# Patient Record
Sex: Female | Born: 1935 | Race: White | Hispanic: No | Marital: Married | Smoking: Former smoker
Health system: Southern US, Community
[De-identification: ages and names within clinical notes are randomized; demographics above are authoritative.]

## PROBLEM LIST (undated history)

## (undated) DIAGNOSIS — E785 Hyperlipidemia, unspecified: Secondary | ICD-10-CM

## (undated) DIAGNOSIS — G629 Polyneuropathy, unspecified: Secondary | ICD-10-CM

## (undated) DIAGNOSIS — F329 Major depressive disorder, single episode, unspecified: Secondary | ICD-10-CM

## (undated) DIAGNOSIS — F32A Depression, unspecified: Secondary | ICD-10-CM

## (undated) DIAGNOSIS — M810 Age-related osteoporosis without current pathological fracture: Secondary | ICD-10-CM

## (undated) DIAGNOSIS — E079 Disorder of thyroid, unspecified: Secondary | ICD-10-CM

## (undated) DIAGNOSIS — K2970 Gastritis, unspecified, without bleeding: Secondary | ICD-10-CM

## (undated) DIAGNOSIS — F419 Anxiety disorder, unspecified: Secondary | ICD-10-CM

## (undated) HISTORY — PX: ABDOMINAL HYSTERECTOMY: SHX81

## (undated) HISTORY — PX: JOINT REPLACEMENT: SHX530

## (undated) HISTORY — PX: TOTAL HIP ARTHROPLASTY: SHX124

## (undated) HISTORY — PX: HERNIA REPAIR: SHX51

---

## 2000-08-21 ENCOUNTER — Encounter: Payer: Self-pay | Admitting: *Deleted

## 2000-08-21 ENCOUNTER — Emergency Department (HOSPITAL_COMMUNITY): Admission: RE | Admit: 2000-08-21 | Discharge: 2000-08-21 | Payer: Self-pay | Admitting: Pediatrics

## 2005-10-08 ENCOUNTER — Ambulatory Visit: Payer: Self-pay | Admitting: Gastroenterology

## 2012-04-11 ENCOUNTER — Emergency Department
Admission: EM | Admit: 2012-04-11 | Discharge: 2012-04-11 | Disposition: A | Discharge status: Home / Self Care | Payer: MEDICARE | Source: Home / Self Care

## 2012-04-11 DIAGNOSIS — N390 Urinary tract infection, site not specified: Secondary | ICD-10-CM

## 2012-04-11 DIAGNOSIS — B029 Zoster without complications: Secondary | ICD-10-CM

## 2012-04-11 HISTORY — DX: Disorder of thyroid, unspecified: E07.9

## 2012-04-11 LAB — POCT URINALYSIS DIP (MANUAL ENTRY)
Blood, UA: NEGATIVE
Glucose, UA: NEGATIVE
Nitrite, UA: NEGATIVE
Protein Ur, POC: 30
Spec Grav, UA: 1.025 (ref 1.005–1.03)
Urobilinogen, UA: 1 (ref 0–1)
pH, UA: 5.5 (ref 5–8)

## 2012-04-11 MED ORDER — CEPHALEXIN 250 MG/5ML PO SUSR: 500.0000 mg | Freq: Three times a day (TID) | ORAL | Status: AC

## 2012-04-11 MED ORDER — CEFTRIAXONE SODIUM 1 G IJ SOLR
1.0000 g | INTRAMUSCULAR | Status: DC
  Administered 2012-04-11: 1 g via INTRAMUSCULAR

## 2012-04-11 MED ORDER — METHYLPREDNISOLONE ACETATE 80 MG/ML IJ SUSP
60.0000 mg | Freq: Once | INTRAMUSCULAR | Status: AC
  Administered 2012-04-11: 60 mg via INTRAMUSCULAR

## 2012-04-11 NOTE — ED Provider Notes (Signed)
History     CSN: [id number]  Arrival date & time 04/11/12  7829   None     Chief Complaint  Patient presents with  . Dysuria  . Herpes Zoster   Patient is a 78 y.o. female presenting with dysuria and rash. The history is provided by the patient.  Dysuria   Rash   Pt states that she was seen by PCP earlier this week for rash. Pt was diagnosed with shingles ( R lower adbomen) and placed on valacyclovir for this. Pt states that she took 2 doses of medicine and became very nauseous and vomited. Pt states that she has chronic nausea s/p abdominal hernia repair x 2. Has phenergan at home. Pt states that she discussed nausea reaction with PCP who told her that all shingles medicines can cause nausea. Pt states that she has not taken medication since this point. Pt states that she has not tried valacyclovir with phenergan to see if this relieves nausea and is not willing to try this. Pt states that rash has slightly improved since Tuesday. Rash has been present for this last week. Pt denies any purulent drainage or blister formation over rash. Pt has been placing calamine lotion over area. Pt states that she has had her shingles vaccine. Pt states this is her 1st flare of shingles.   Pt also reports dysuria over the last 3 days.  + increased urinary frequency and burning.  No hematuria.  Mild chills, no fever.  Nausea and vomiting prior to onset of dysuria related to antiviral medication per pt.  No vaginal irritation.    Past Medical History  Diagnosis Date  . Thyroid disease     Past Surgical History  Procedure Date  . Hernia repair     History reviewed. No pertinent family history.  History  Substance Use Topics  . Smoking status: Former Games developer  . Smokeless tobacco: Not on file  . Alcohol Use: Yes    OB History    Grav Para Term Preterm Abortions TAB SAB Ect Mult Living                  Review of Systems  Genitourinary: Positive for dysuria.  Skin: Positive for rash.   All other systems reviewed and are negative.    Allergies  Ivp dye and Sulfa antibiotics  Home Medications   Current Outpatient Rx  Name Route Sig Dispense Refill  . LEVOTHYROXINE SODIUM 75 MCG PO TABS Oral Take 75 mcg by mouth daily.    . CEPHALEXIN 250 MG/5ML PO SUSR Oral Take 10 mLs (500 mg total) by mouth 3 (three) times daily. 250 mL 0    BP 112/76  Pulse 96  Temp 97.3 F (36.3 C) (Oral)  Resp 18  Ht 5\' 2"  (1.575 m)  Wt 112 lb (50.803 kg)  BMI 20.49 kg/m2  SpO2 97%  Physical Exam  Constitutional: She is oriented to person, place, and time. She appears well-developed and well-nourished.  HENT:  Head: Normocephalic and atraumatic.  Eyes: Conjunctivae are normal. Pupils are equal, round, and reactive to light.  Neck: Normal range of motion. Neck supple.  Cardiovascular: Normal rate and regular rhythm.   Pulmonary/Chest: Effort normal and breath sounds normal.  Abdominal: Soft.       Mild suprapubic pain  RLQ rash   Musculoskeletal: Normal range of motion.  Neurological: She is alert and oriented to person, place, and time.  Skin: Skin is warm.  Erythematous scaling rash along dermatomal distribution in RLQ (T12/L1)     ED Course  Procedures (including critical care time)   Labs Reviewed  POCT URINALYSIS DIP (MANUAL ENTRY)  URINE CULTURE   No results found.   1. Shingles   2. UTI (lower urinary tract infection)       MDM  1:  Depomedrol 60 mg IM x1 given for inflammatory component of rash  Discussed importance of compliance with antiviral medication for expedited resolution of flare.  Broached issue of use with phenergan to help prevent nausea.  Case discussed with on-call pharmacist.  Risk of nausea is same across this antiviral drug class.  Recommended that pt can use half dose to increase tolerability.  Plan for pt to follow up with PCP in 1-2 days.  General red flags discussed at length with pt.  Handout given  2: Rocephin 1gm  IM x1 Urine culture  Will place on keflex x 7 days.  Discussed using phenergan with this.  Vital signs and physical exam generally reassuring.  Discussed infectious red flags at length.  Follow up with PCP in 1-2 days.       The patient and/or caregiver has been counseled thoroughly with regard to treatment plan and/or medications prescribed including dosage, schedule, interactions, rationale for use, and possible side effects and they verbalize understanding. Diagnoses and expected course of recovery discussed and will return if not improved as expected or if the condition worsens. Patient and/or caregiver verbalized understanding.              Doree Albee, MD 04/11/12 1241

## 2012-04-11 NOTE — ED Notes (Signed)
States she has burning when she urinate x2 days, currentlyhas shingles but states she could not take it d/t nausea

## 2012-04-13 LAB — URINE CULTURE: Colony Count: 25000

## 2012-11-20 ENCOUNTER — Emergency Department
Admission: EM | Admit: 2012-11-20 | Discharge: 2012-11-20 | Disposition: A | Discharge status: Home / Self Care | Payer: MEDICARE | Source: Home / Self Care

## 2012-11-20 ENCOUNTER — Encounter: Payer: Self-pay | Admitting: Emergency Medicine

## 2012-11-20 DIAGNOSIS — R300 Dysuria: Secondary | ICD-10-CM

## 2012-11-20 LAB — POCT CBC W AUTO DIFF (K'VILLE URGENT CARE)

## 2012-11-20 LAB — POCT URINALYSIS DIP (MANUAL ENTRY)
Bilirubin, UA: NEGATIVE
Ketones, POC UA: NEGATIVE

## 2012-11-20 MED ORDER — CEFTRIAXONE SODIUM 1 G IJ SOLR
1.0000 g | Freq: Once | INTRAMUSCULAR | Status: AC
  Administered 2012-11-20: 1 g via INTRAMUSCULAR

## 2012-11-20 NOTE — ED Notes (Signed)
Dysuria x 2 weeks, having reactions to meds and can't get rid of UTI, has appt with urology next Wed, but feels she needs help today

## 2012-11-20 NOTE — ED Notes (Signed)
We set appt today with Dr Edwyna Shell at [facility] @ 2pm, she has appt with them on Wed, April 9, but felt she needed to be seen earlier.

## 2012-11-20 NOTE — ED Provider Notes (Addendum)
History     CSN: [id number]  Arrival date & time 11/20/12  0950   None     Chief Complaint  Patient presents with  . Dysuria   HPI Patient presents today with chief complaint of dysuria. Patient states she's had to 3 urinary tract infection since January. Patient states that she was placed on Cipro by her PCP last week for UTI. Patient states she developed a splotchy rash. Patient states she was switched to [facility] for coverage. Patient states she developed another rash. Patient states she's had mild chills over the past 24 hours. Mild back pain. Patient has followup with urologist early next week the patient states that she cannot wait that long. Mild dysuria mild increased urinary frequency. No hematuria. No discharge. Patient reports having mild right-sided flank pain or past 2-3 days. Patient states she has a right-sided renal cyst. Patient is unsure if this may be associated with flank pain.  Patient is noted to have been seen here the urgent care for similar symptoms back in August of last year.  Patient was placed on Keflex for treatment. She had a subclinical urine culture with multiple bacterial morphotypes.Patient says she had no issues with antibiotics last time.   Past Medical History  Diagnosis Date  . Thyroid disease     Past Surgical History  Procedure Laterality Date  . Hernia repair      Family History  Problem Relation Age of Onset  . Cancer Mother   . Cancer Father     History  Substance Use Topics  . Smoking status: Former Games developer  . Smokeless tobacco: Not on file  . Alcohol Use: Yes    OB History   Grav Para Term Preterm Abortions TAB SAB Ect Mult Living                  Review of Systems  All other systems reviewed and are negative.    Allergies  Amoxicillin; Barium sulfate; Cephalexin; Ciprofloxacin; Dexilant; Doxycycline; Elavil; Fosamax; Iodine; Ivp dye; and Sulfa antibiotics  Home Medications   Current Outpatient Rx  Name  Route  Sig   Dispense  Refill  . doxepin (SINEQUAN) 10 MG capsule   Oral   Take 10 mg by mouth.         . gabapentin (NEURONTIN) 100 MG capsule   Oral   Take 100 mg by mouth 3 (three) times daily.         Marland Kitchen LORazepam (ATIVAN) 0.5 MG tablet   Oral   Take 0.5 mg by mouth every 8 (eight) hours.         . pantoprazole (PROTONIX) 40 MG tablet   Oral   Take 40 mg by mouth daily.         . pravastatin (PRAVACHOL) 40 MG tablet   Oral   Take 40 mg by mouth daily.         . promethazine (PHENERGAN) 25 MG tablet   Oral   Take 25 mg by mouth every 6 (six) hours as needed for nausea.         . quinapril (ACCUPRIL) 5 MG tablet   Oral   Take by mouth at bedtime.         . sucralfate (CARAFATE) 1 G tablet   Oral   Take 1 g by mouth 4 (four) times daily.         Marland Kitchen levothyroxine (SYNTHROID, LEVOTHROID) 75 MCG tablet   Oral   Take 75 mcg by  mouth daily.           BP 151/88  Pulse 88  Temp(Src) 97.9 F (36.6 C) (Oral)  Ht 5\' 2"  (1.575 m)  Wt 109 lb (49.442 kg)  BMI 19.93 kg/m2  SpO2 97%  Physical Exam  Constitutional: She appears well-developed and well-nourished.  HENT:  Head: Normocephalic and atraumatic.  Eyes: Conjunctivae are normal. Pupils are equal, round, and reactive to light.  Neck: Normal range of motion.  Cardiovascular: Normal rate, regular rhythm and normal heart sounds.   Pulmonary/Chest: Effort normal.  Abdominal: Soft. Bowel sounds are normal.  Minimal R sided flank pain  No abdominal pain/suprapubic tenderness   Musculoskeletal: Normal range of motion.  Neurological: She is alert.  Skin: Skin is warm.    ED Course  Procedures (including critical care time)  Labs Reviewed  URINE CULTURE  POCT URINALYSIS DIP (MANUAL ENTRY)  POCT CBC W AUTO DIFF ([facility])   No results found.   1. Dysuria       MDM  Patient afebrile today. White blood cell count within normal limits. Urinalysis with trace leukocytes, otherwise  normal. Patient given 1 g Rocephin IM x1. Urine culture. Patient has taken Rocephin in the past with no reactions. This has become a fairly recurrent issue of the past 2-3 months with patient developing multiple medication/antibiotic allergies over this time frame. Same day appointment made for patient to be reevaluated by urology. Patient states she has someone available that can drive her. Discuss infectious and GU red flags at length with patient.      The patient and/or caregiver has been counseled thoroughly with regard to treatment plan and/or medications prescribed including dosage, schedule, interactions, rationale for use, and possible side effects and they verbalize understanding. Diagnoses and expected course of recovery discussed and will return if not improved as expected or if the condition worsens. Patient and/or caregiver verbalized understanding.             Doree Albee, MD 11/20/12 1105  Doree Albee, MD 11/20/12 [phone]

## 2012-11-22 ENCOUNTER — Telehealth: Payer: Self-pay | Admitting: *Deleted

## 2014-06-18 ENCOUNTER — Emergency Department (INDEPENDENT_AMBULATORY_CARE_PROVIDER_SITE_OTHER): Payer: Medicare PPO

## 2014-06-18 ENCOUNTER — Encounter: Payer: Self-pay | Admitting: Emergency Medicine

## 2014-06-18 ENCOUNTER — Emergency Department
Admission: EM | Admit: 2014-06-18 | Discharge: 2014-06-18 | Disposition: A | Discharge status: Home / Self Care | Payer: Medicare PPO | Source: Home / Self Care | Attending: Emergency Medicine | Admitting: Emergency Medicine

## 2014-06-18 DIAGNOSIS — R52 Pain, unspecified: Secondary | ICD-10-CM

## 2014-06-18 DIAGNOSIS — I700 Atherosclerosis of aorta: Secondary | ICD-10-CM

## 2014-06-18 DIAGNOSIS — M545 Low back pain: Secondary | ICD-10-CM

## 2014-06-18 DIAGNOSIS — R109 Unspecified abdominal pain: Secondary | ICD-10-CM

## 2014-06-18 HISTORY — DX: Depression, unspecified: F32.A

## 2014-06-18 HISTORY — DX: Anxiety disorder, unspecified: F41.9

## 2014-06-18 HISTORY — DX: Age-related osteoporosis without current pathological fracture: M81.0

## 2014-06-18 HISTORY — DX: Major depressive disorder, single episode, unspecified: F32.9

## 2014-06-18 HISTORY — DX: Gastritis, unspecified, without bleeding: K29.70

## 2014-06-18 HISTORY — DX: Polyneuropathy, unspecified: G62.9

## 2014-06-18 LAB — POCT URINALYSIS DIP (MANUAL ENTRY)
Bilirubin, UA: NEGATIVE
Glucose, UA: NEGATIVE
Ketones, POC UA: NEGATIVE
NITRITE UA: NEGATIVE
PROTEIN UA: NEGATIVE
RBC UA: NEGATIVE
SPEC GRAV UA: 1.015
UROBILINOGEN UA: 0.2
pH, UA: 7

## 2014-06-18 NOTE — ED Notes (Signed)
Low back pain x 2 days.

## 2014-06-18 NOTE — ED Notes (Signed)
Patient transported to X-ray via wheelchair 

## 2014-06-18 NOTE — Discharge Instructions (Signed)
Arthritis, Nonspecific °Arthritis is inflammation of a joint. This usually means pain, redness, warmth or swelling are present. One or more joints may be involved. There are a number of types of arthritis. Your caregiver may not be able to tell what type of arthritis you have right away. °CAUSES  °The most common cause of arthritis is the wear and tear on the joint (osteoarthritis). This causes damage to the cartilage, which can break down over time. The knees, hips, back and neck are most often affected by this type of arthritis. °Other types of arthritis and common causes of joint pain include: °· Sprains and other injuries near the joint. Sometimes minor sprains and injuries cause pain and swelling that develop hours later. °· Rheumatoid arthritis. This affects hands, feet and knees. It usually affects both sides of your body at the same time. It is often associated with chronic ailments, fever, weight loss and general weakness. °· Crystal arthritis. Gout and pseudo gout can cause occasional acute severe pain, redness and swelling in the foot, ankle, or knee. °· Infectious arthritis. Bacteria can get into a joint through a break in overlying skin. This can cause infection of the joint. Bacteria and viruses can also spread through the blood and affect your joints. °· Drug, infectious and allergy reactions. Sometimes joints can become mildly painful and slightly swollen with these types of illnesses. °SYMPTOMS  °· Pain is the main symptom. °· Your joint or joints can also be red, swollen and warm or hot to the touch. °· You may have a fever with certain types of arthritis, or even feel overall ill. °· The joint with arthritis will hurt with movement. Stiffness is present with some types of arthritis. °DIAGNOSIS  °Your caregiver will suspect arthritis based on your description of your symptoms and on your exam. Testing may be needed to find the type of arthritis: °· Blood and sometimes urine tests. °· X-ray tests  and sometimes CT or MRI scans. °· Removal of fluid from the joint (arthrocentesis) is done to check for bacteria, crystals or other causes. Your caregiver (or a specialist) will numb the area over the joint with a local anesthetic, and use a needle to remove joint fluid for examination. This procedure is only minimally uncomfortable. °· Even with these tests, your caregiver may not be able to tell what kind of arthritis you have. Consultation with a specialist (rheumatologist) may be helpful. °TREATMENT  °Your caregiver will discuss with you treatment specific to your type of arthritis. If the specific type cannot be determined, then the following general recommendations may apply. °Treatment of severe joint pain includes: °· Rest. °· Elevation. °· Anti-inflammatory medication (for example, ibuprofen) may be prescribed. Avoiding activities that cause increased pain. °· Only take over-the-counter or prescription medicines for pain and discomfort as recommended by your caregiver. °· Cold packs over an inflamed joint may be used for 10 to 15 minutes every hour. Hot packs sometimes feel better, but do not use overnight. Do not use hot packs if you are diabetic without your caregiver's permission. °· A cortisone shot into arthritic joints may help reduce pain and swelling. °· Any acute arthritis that gets worse over the next 1 to 2 days needs to be looked at to be sure there is no joint infection. °Long-term arthritis treatment involves modifying activities and lifestyle to reduce joint stress jarring. This can include weight loss. Also, exercise is needed to nourish the joint cartilage and remove waste. This helps keep the muscles   around the joint strong. °HOME CARE INSTRUCTIONS  °· Do not take aspirin to relieve pain if gout is suspected. This elevates uric acid levels. °· Only take over-the-counter or prescription medicines for pain, discomfort or fever as directed by your caregiver. °· Rest the joint as much as  possible. °· If your joint is swollen, keep it elevated. °· Use crutches if the painful joint is in your leg. °· Drinking plenty of fluids may help for certain types of arthritis. °· Follow your caregiver's dietary instructions. °· Try low-impact exercise such as: °¨ Swimming. °¨ Water aerobics. °¨ Biking. °¨ Walking. °· Morning stiffness is often relieved by a warm shower. °· Put your joints through regular range-of-motion. °SEEK MEDICAL CARE IF:  °· You do not feel better in 24 hours or are getting worse. °· You have side effects to medications, or are not getting better with treatment. °SEEK IMMEDIATE MEDICAL CARE IF:  °· You have a fever. °· You develop severe joint pain, swelling or redness. °· Many joints are involved and become painful and swollen. °· There is severe back pain and/or leg weakness. °· You have loss of bowel or bladder control. °Document Released: 09/12/2004 Document Revised: 10/28/2011 Document Reviewed: 09/28/2008 °ExitCare® Patient Information ©2015 [employer]. This information is not intended to replace advice given to you by your health care provider. Make sure you discuss any questions you have with your health care provider. ° °

## 2014-06-18 NOTE — ED Provider Notes (Signed)
CSN: [id number]     Arrival date & time 06/18/14  1412 History   First MD Initiated Contact with Patient 06/18/14 1439     Chief Complaint  Patient presents with  . Back Pain   (Consider location/radiation/quality/duration/timing/severity/associated sxs/prior Treatment) Patient is a 80 y.o. female presenting with back pain. The history is provided by the patient. No language interpreter was used.  Back Pain Location:  Lumbar spine Quality:  Aching Radiates to:  Does not radiate Pain severity:  Moderate Pain is:  Same all the time Onset quality:  Sudden Timing:  Constant Progression:  Worsening Chronicity:  New Context: not recent illness   Relieved by:  Nothing Worsened by:  Nothing tried Ineffective treatments:  None tried Associated symptoms: no abdominal pain   Risk factors: no hx of cancer    patient complains of pain in her low back pain is worse with walking worse with moving she reports she is also concerned about urinary tract infection. Patient reports that she thinks she has arthritis in her back.  Past Medical History  Diagnosis Date  . Thyroid disease   . Gastritis   . Depression   . Neuropathy   . Anxiety   . Osteoporosis    Past Surgical History  Procedure Laterality Date  . Hernia repair    . Joint replacement     Family History  Problem Relation Age of Onset  . Cancer Mother   . Cancer Father    History  Substance Use Topics  . Smoking status: Former Games developermoker  . Smokeless tobacco: Not on file  . Alcohol Use: Yes   OB History   Grav Para Term Preterm Abortions TAB SAB Ect Mult Living                 Review of Systems  Gastrointestinal: Negative for abdominal pain.  Musculoskeletal: Positive for back pain.  All other systems reviewed and are negative.   Allergies  Amoxicillin; Azithromycin; Barium sulfate; Cephalexin; Ciprofloxacin; Dexilant; Doxycycline; Elavil; Fosamax; Iodine; Ivp dye; Macrobid; Metoclopramide; Metronidazole; Sulfa  antibiotics; Tricyclic antidepressants; and Zofran  Home Medications   Prior to Admission medications   Medication Sig Start Date End Date Taking? Authorizing Provider  doxepin (SINEQUAN) 10 MG capsule Take 10 mg by mouth.    Historical Provider, MD  gabapentin (NEURONTIN) 100 MG capsule Take 100 mg by mouth 3 (three) times daily.    Historical Provider, MD  levothyroxine (SYNTHROID, LEVOTHROID) 75 MCG tablet Take 75 mcg by mouth daily.    Historical Provider, MD  LORazepam (ATIVAN) 0.5 MG tablet Take 0.5 mg by mouth every 8 (eight) hours.    Historical Provider, MD  pantoprazole (PROTONIX) 40 MG tablet Take 40 mg by mouth daily.    Historical Provider, MD  pravastatin (PRAVACHOL) 40 MG tablet Take 40 mg by mouth daily.    Historical Provider, MD  promethazine (PHENERGAN) 25 MG tablet Take 25 mg by mouth every 6 (six) hours as needed for nausea.    Historical Provider, MD  quinapril (ACCUPRIL) 5 MG tablet Take by mouth at bedtime.    Historical Provider, MD  sucralfate (CARAFATE) 1 G tablet Take 1 g by mouth 4 (four) times daily.    Historical Provider, MD   BP 149/85  Pulse 98  Temp(Src) 98.2 F (36.8 C) (Oral)  Resp 18  Ht 5\' 2"  (1.575 m)  Wt 110 lb (49.896 kg)  BMI 20.11 kg/m2  SpO2 98% Physical Exam  Nursing note and vitals reviewed.  Constitutional: She is oriented to person, place, and time. She appears well-developed and well-nourished.  HENT:  Head: Normocephalic and atraumatic.  Eyes: Conjunctivae and EOM are normal. Pupils are equal, round, and reactive to light.  Neck: Normal range of motion.  Cardiovascular: Normal rate and normal heart sounds.   Pulmonary/Chest: Effort normal.  Abdominal: Soft. She exhibits no distension.  Musculoskeletal:  Tender lumbar spine,  Pain with being forward, decreased range of motion,  nv and ns intact   Neurological: She is alert and oriented to person, place, and time.  Psychiatric: She has a normal mood and affect.    ED Course   Procedures (including critical care time) Labs Review Labs Reviewed  POCT URINALYSIS DIP (MANUAL ENTRY)    Imaging Review Dg Lumbar Spine Complete  06/18/2014   CLINICAL DATA:  Two day history of low back pain.  No injury noted.  EXAM: LUMBAR SPINE - COMPLETE 4+ VIEW  COMPARISON:  None.  FINDINGS: Frontal, lateral, spot lumbosacral lateral, and bilateral oblique views were obtained. There are 5 non-rib-bearing lumbar type vertebral bodies. There is no fracture or spondylolisthesis. Disc spaces appear intact. There is facet osteoarthritic change at L5-S1 bilaterally. There are foci of atherosclerotic calcification in the aorta.  IMPRESSION: Mild osteoarthritic change at L5-S1. Atherosclerotic calcification in the aorta. No fracture or spondylolisthesis.   Electronically Signed   By: Bretta BangWilliam  Woodruff M.D.   On: 06/18/2014 15:56   Dg Pelvis 1-2 Views  06/18/2014   CLINICAL DATA:  Two day history of pain.  EXAM: PELVIS - 1-2 VIEW  COMPARISON:  None.  FINDINGS: There is a total hip prosthesis on the left which is well seated. There is mild narrowing of the right hip joint. No fracture or dislocation. No erosive change.  IMPRESSION: Mild narrowing right hip joint. Total hip prosthesis on the left, well-seated. No fracture or dislocation.   Electronically Signed   By: Bretta BangWilliam  Woodruff M.D.   On: 06/18/2014 15:56     MDM   1. Flank pain   2. Pain    Patient counseled on x-ray result on arthritis I will send her urine for culture as urine shows leukocytes but no nitrates patient does not have urinary symptoms only pain in back that seems to be muscular versus skeletal patient reports that she only takes Tylenol for pain she does not want pain medicine at this time because it causes her GI problems. Patient will follow-up with her physician Dr. Regino Schultzeitter Brown     Elson AreasLeslie K Miracle Criado, PA-C 06/18/14 1626

## 2014-06-20 LAB — URINE CULTURE

## 2015-01-11 ENCOUNTER — Emergency Department
Admission: EM | Admit: 2015-01-11 | Discharge: 2015-01-11 | Disposition: A | Discharge status: Home / Self Care | Payer: Medicare HMO | Source: Home / Self Care | Attending: Family Medicine | Admitting: Family Medicine

## 2015-01-11 ENCOUNTER — Encounter: Payer: Self-pay | Admitting: *Deleted

## 2015-01-11 DIAGNOSIS — L84 Corns and callosities: Secondary | ICD-10-CM

## 2015-01-11 NOTE — Discharge Instructions (Signed)
Apply a piece of Moleskin to each side of affected toes.  Remove pads and replace when they become wet.   Corns and Calluses Corns are small areas of thickened skin that usually occur on the top, sides, or tip of a toe. They contain a cone-shaped core with a point that can press on a nerve below. This causes pain. Calluses are areas of thickened skin that usually develop on hands, fingers, palms, soles of the feet, and heels. These are areas that experience frequent friction or pressure. CAUSES  Corns are usually the result of rubbing (friction) or pressure from shoes that are too tight or do not fit properly. Calluses are caused by repeated friction and pressure on the affected areas. SYMPTOMS  A hard growth on the skin.  Pain or tenderness under the skin.  Sometimes, redness and swelling.  Increased discomfort while wearing tight-fitting shoes. DIAGNOSIS  Your caregiver can usually tell what the problem is by doing a physical exam. TREATMENT  Removing the cause of the friction or pressure is usually the only treatment needed. However, sometimes medicines can be used to help soften the hardened, thickened areas. These medicines include salicylic acid plasters and 12% ammonium lactate lotion. These medicines should only be used under the direction of your caregiver. HOME CARE INSTRUCTIONS   Try to remove pressure from the affected area.  You may wear donut-shaped corn pads to protect your skin.  You may use a pumice stone or nonmetallic nail file to gently reduce the thickness of a corn.  Wear properly fitted footwear.  If you have calluses on the hands, wear gloves during activities that cause friction.  If you have diabetes, you should regularly examine your feet. Tell your caregiver if you notice any problems with your feet. SEEK IMMEDIATE MEDICAL CARE IF:   You have increased pain, swelling, redness, or warmth in the affected area.  Your corn or callus starts to drain fluid  or bleeds.  You are not getting better, even with treatment. Document Released: 05/11/2004 Document Revised: 10/28/2011 Document Reviewed: 04/02/2011 [facility] Patient Information 2015 [employer], Maryland[employer]. This information is not intended to replace advice given to you by your health care provider. Make sure you discuss any questions you have with your health care provider.

## 2015-01-11 NOTE — ED Notes (Signed)
Pt c/o RT 3rd toe pain x 2 wks. Denies injury.

## 2015-01-11 NOTE — ED Provider Notes (Signed)
CSN: [id number]     Arrival date & time 01/11/15  1049 History   First MD Initiated Contact with Patient 01/11/15 1156     Chief Complaint  Patient presents with  . Toe Pain      HPI Comments: Patient complains of two week history of pain betwee her right 2nd and 3rd toes.  She denies injury  Patient is a 81 y.o. female presenting with toe pain. The history is provided by the patient.  Toe Pain This is a new problem. Episode onset: 2 weeks ago. The problem occurs constantly. The problem has not changed since onset.Associated symptoms comments: none. The symptoms are aggravated by walking (wearing shoes). Nothing relieves the symptoms. Treatments tried: cotton pad between toes. The treatment provided mild relief.    Past Medical History  Diagnosis Date  . Thyroid disease   . Gastritis   . Depression   . Neuropathy   . Anxiety   . Osteoporosis    Past Surgical History  Procedure Laterality Date  . Hernia repair    . Joint replacement     Family History  Problem Relation Age of Onset  . Cancer Mother   . Cancer Father    History  Substance Use Topics  . Smoking status: Former Games developermoker  . Smokeless tobacco: Not on file  . Alcohol Use: Yes   OB History    No data available     Review of Systems  All other systems reviewed and are negative.   Allergies  Amoxicillin; Azithromycin; Barium sulfate; Cephalexin; Ciprofloxacin; Dexilant; Doxycycline; Elavil; Fosamax; Iodine; Ivp dye; Macrobid; Metoclopramide; Metronidazole; Sulfa antibiotics; Tricyclic antidepressants; and Zofran  Home Medications   Prior to Admission medications   Medication Sig Start Date End Date Taking? Authorizing Provider  doxepin (SINEQUAN) 10 MG capsule Take 10 mg by mouth.    Historical Provider, MD  gabapentin (NEURONTIN) 100 MG capsule Take 100 mg by mouth 3 (three) times daily.    Historical Provider, MD  levothyroxine (SYNTHROID, LEVOTHROID) 75 MCG tablet Take 75 mcg by mouth daily.     Historical Provider, MD  LORazepam (ATIVAN) 0.5 MG tablet Take 0.5 mg by mouth every 8 (eight) hours.    Historical Provider, MD  pantoprazole (PROTONIX) 40 MG tablet Take 40 mg by mouth daily.    Historical Provider, MD  pravastatin (PRAVACHOL) 40 MG tablet Take 40 mg by mouth daily.    Historical Provider, MD  promethazine (PHENERGAN) 25 MG tablet Take 25 mg by mouth every 6 (six) hours as needed for nausea.    Historical Provider, MD  quinapril (ACCUPRIL) 5 MG tablet Take by mouth at bedtime.    Historical Provider, MD  sucralfate (CARAFATE) 1 G tablet Take 1 g by mouth 4 (four) times daily.    Historical Provider, MD   BP 137/81 mmHg  Pulse 91  Temp(Src) 98.1 F (36.7 C) (Oral)  Resp 16  Wt 114 lb (51.71 kg)  SpO2 99% Physical Exam  Constitutional: She is oriented to person, place, and time. She appears well-developed and well-nourished. No distress.  HENT:  Head: Normocephalic.  Eyes: Pupils are equal, round, and reactive to light.  Neck: Neck supple.  Cardiovascular: Normal heart sounds.   Pulmonary/Chest: Breath sounds normal.  Abdominal: There is no tenderness.  Musculoskeletal:       Right foot: There is normal range of motion, no tenderness, no bony tenderness, no swelling, normal capillary refill and no deformity.       Feet:  Right foot has a bunion present. The contact surfaces between the 2nd and 3rd toes each have a callus present that is mildly tender to palpation.  There is no swelling, erythema, or drainage present.  Neurological: She is alert and oriented to person, place, and time.  Skin: Skin is warm and dry.  Nursing note and vitals reviewed.   ED Course  Procedures  none   MDM   1. Skin callus; no evidence cellulitis     Apply a piece of Moleskin to each side of affected toes.  Remove pads and replace when they become wet. Followup with Family Doctor if not improved in one week.     Lattie Haw, MD 01/16/15 1351

## 2015-08-16 ENCOUNTER — Emergency Department (INDEPENDENT_AMBULATORY_CARE_PROVIDER_SITE_OTHER): Payer: Medicare HMO

## 2015-08-16 ENCOUNTER — Emergency Department
Admission: EM | Admit: 2015-08-16 | Discharge: 2015-08-16 | Disposition: A | Discharge status: Home / Self Care | Payer: Medicare HMO | Source: Home / Self Care | Attending: Emergency Medicine | Admitting: Emergency Medicine

## 2015-08-16 ENCOUNTER — Encounter: Payer: Self-pay | Admitting: *Deleted

## 2015-08-16 DIAGNOSIS — S76012A Strain of muscle, fascia and tendon of left hip, initial encounter: Secondary | ICD-10-CM | POA: Diagnosis not present

## 2015-08-16 DIAGNOSIS — Z9889 Other specified postprocedural states: Secondary | ICD-10-CM | POA: Diagnosis not present

## 2015-08-16 DIAGNOSIS — M25551 Pain in right hip: Secondary | ICD-10-CM | POA: Diagnosis not present

## 2015-08-16 NOTE — ED Notes (Signed)
Pt c/o LT hip pain x 5 days. Denies injury. She reports a history of LT hip partial replacement 2 years ago. She has taken Aleve with some relief. She has not taken any OTC meds today.

## 2015-08-16 NOTE — ED Provider Notes (Signed)
CSN: [id number]     Arrival date & time 08/16/15  1040 History   First MD Initiated Contact with Patient 08/16/15 1128     Chief Complaint  Patient presents with  . Hip Pain   (Consider location/radiation/quality/duration/timing/severity/associated sxs/prior Treatment) HPI 5 days ago, she did a lot of activity with walking and bending and lifting, but recalls no trauma. Did not fall. Chief complaint: Left lateral hip pain for 5 days since doing all that activity. 3 out of 5 intensity at rest. 5 out of 10 with movement. Sometimes can go up to 7 or 8 out of 10 with severe movement. It's sharp. Occasionally radiating to left groin but no other GU symptoms. She tried Aleve and that helped somewhat. History of osteoporosis, and she takes Prolia injections twice a year, prescribed by her PCP she states. Left hip replacement surgery 2 years ago she states. Past Medical History  Diagnosis Date  . Thyroid disease   . Gastritis   . Depression   . Neuropathy (HCC)   . Anxiety   . Osteoporosis    Past Surgical History  Procedure Laterality Date  . Hernia repair    . Joint replacement     Family History  Problem Relation Age of Onset  . Cancer Mother   . Cancer Father    Social History  Substance Use Topics  . Smoking status: Former Games developer  . Smokeless tobacco: None  . Alcohol Use: No   OB History    No data available     Review of Systems  Constitutional: Negative for fever.  Respiratory: Negative for shortness of breath.   Cardiovascular: Negative for chest pain.  Gastrointestinal: Negative for nausea, vomiting and abdominal pain.  Genitourinary: Negative.   Skin: Negative for rash.  Neurological: Negative for syncope and numbness.    Allergies  Amoxicillin; Azithromycin; Barium sulfate; Cephalexin; Ciprofloxacin; Dexilant; Doxycycline; Elavil; Fosamax; Iodine; Ivp dye; Macrobid; Metoclopramide; Metronidazole; Sulfa antibiotics; Tricyclic antidepressants; and  Zofran  Home Medications   Prior to Admission medications   Medication Sig Start Date End Date Taking? Authorizing Provider  denosumab (PROLIA) 60 MG/ML SOLN injection Inject 60 mg into the skin every 6 (six) months. Administer in upper arm, thigh, or abdomen   Yes Historical Provider, MD  doxepin (SINEQUAN) 10 MG capsule Take 10 mg by mouth.    Historical Provider, MD  gabapentin (NEURONTIN) 100 MG capsule Take 100 mg by mouth 3 (three) times daily.    Historical Provider, MD  levothyroxine (SYNTHROID, LEVOTHROID) 75 MCG tablet Take 75 mcg by mouth daily.    Historical Provider, MD  LORazepam (ATIVAN) 0.5 MG tablet Take 0.5 mg by mouth every 8 (eight) hours.    Historical Provider, MD  pantoprazole (PROTONIX) 40 MG tablet Take 40 mg by mouth daily.    Historical Provider, MD  pravastatin (PRAVACHOL) 40 MG tablet Take 40 mg by mouth daily.    Historical Provider, MD  promethazine (PHENERGAN) 25 MG tablet Take 25 mg by mouth every 6 (six) hours as needed for nausea.    Historical Provider, MD  quinapril (ACCUPRIL) 5 MG tablet Take by mouth at bedtime.    Historical Provider, MD  sucralfate (CARAFATE) 1 G tablet Take 1 g by mouth 4 (four) times daily.    Historical Provider, MD   Meds Ordered and Administered this Visit  Medications - No data to display  BP 172/84 mmHg  Pulse 96  Temp(Src) 97.4 F (36.3 C) (Oral)  Resp 16  Ht  5\' 2"  (1.575 m)  Wt 115 lb (52.164 kg)  BMI 21.03 kg/m2  SpO2 99% No data found.   Physical Exam  Constitutional: She is oriented to person, place, and time. She appears well-developed and well-nourished. No distress.  HENT:  Head: Normocephalic and atraumatic.  Eyes: Conjunctivae and EOM are normal. Pupils are equal, round, and reactive to light. No scleral icterus.  Neck: Normal range of motion.  Cardiovascular: Normal rate.   Pulmonary/Chest: Effort normal.  Abdominal: She exhibits no distension.  Musculoskeletal: Normal range of motion.       Left  hip: She exhibits tenderness and bony tenderness. She exhibits normal range of motion.       Legs: Left hip: Internal and external rotation within normal limits. These motions do not exacerbate the pain. She can weight-bear, but this causes some left hip pain. Left knee exam normal. Remainder of left lower extremity exam within normal limits. No calf tenderness.  Neurological: She is alert and oriented to person, place, and time.  Skin: Skin is warm.  Psychiatric: She has a normal mood and affect.  Nursing note and vitals reviewed.   ED Course  Procedures (including critical care time)  Labs Review Labs Reviewed - No data to display  Imaging Review Dg Hip Unilat With Pelvis 2-3 Views Left  08/16/2015  CLINICAL DATA:  LEFT hip pain for 5 days. No injury. Prior hip replacement surgery 2 years ago. EXAM: DG HIP (WITH OR WITHOUT PELVIS) 2-3V LEFT COMPARISON:  06/18/2014. FINDINGS: Previous LEFT total hip arthroplasty. No pelvic fracture or prosthetic dislocation. There is slight lucency surrounding the stem in its distal most aspect (arrows). This is not an acute finding, but could contribute to discomfort. It is unclear if this has significantly progressed from priors. Elective Orthopedic consultation may be warranted. IMPRESSION: No evidence for prosthetic dislocation or pelvic fracture. Slight loosening of the stem with periprosthetic lucency inferiorly. Electronically Signed   By: Elsie StainJohn T Curnes M.D.   On: 08/16/2015 12:07       MDM   1. Strain of left hip, initial encounter     X-ray left hip: No evidence for prosthetic dislocation or pelvic fracture. Per radiologist, "Slight loosening of the stem with periprosthetic lucency inferiorly. " I explained this to patient. Questions invited and answered as best I could. I offered pain medication, but she declined any prescription. She prefers to use OTC Aleve as that has helped. She takes Aleve with Protonix to prevent GI  upset. Follow-up with Dr Wylene SimmerPollard ,her orthopedist within 1 week. I gave her a copy of x-ray report. We made her a disc of her left hip x-ray for her to bring with her to orthopedist appointment next week.  Precautions discussed. Red flags discussed.--Emergency room if any red flag Questions invited and answered. Patient voiced understanding and agreement.    Lajean Manesavid Massey, MD 08/16/15 [phone]

## 2016-03-07 IMAGING — CR DG LUMBAR SPINE COMPLETE 4+V
5 series · 5 of 5 positions shown · non-contrast
Comparison: None.

CLINICAL DATA: Two day history of low back pain.  No injury noted.

EXAM:
LUMBAR SPINE - COMPLETE 4+ VIEW

[view not recorded (1 of 5)]
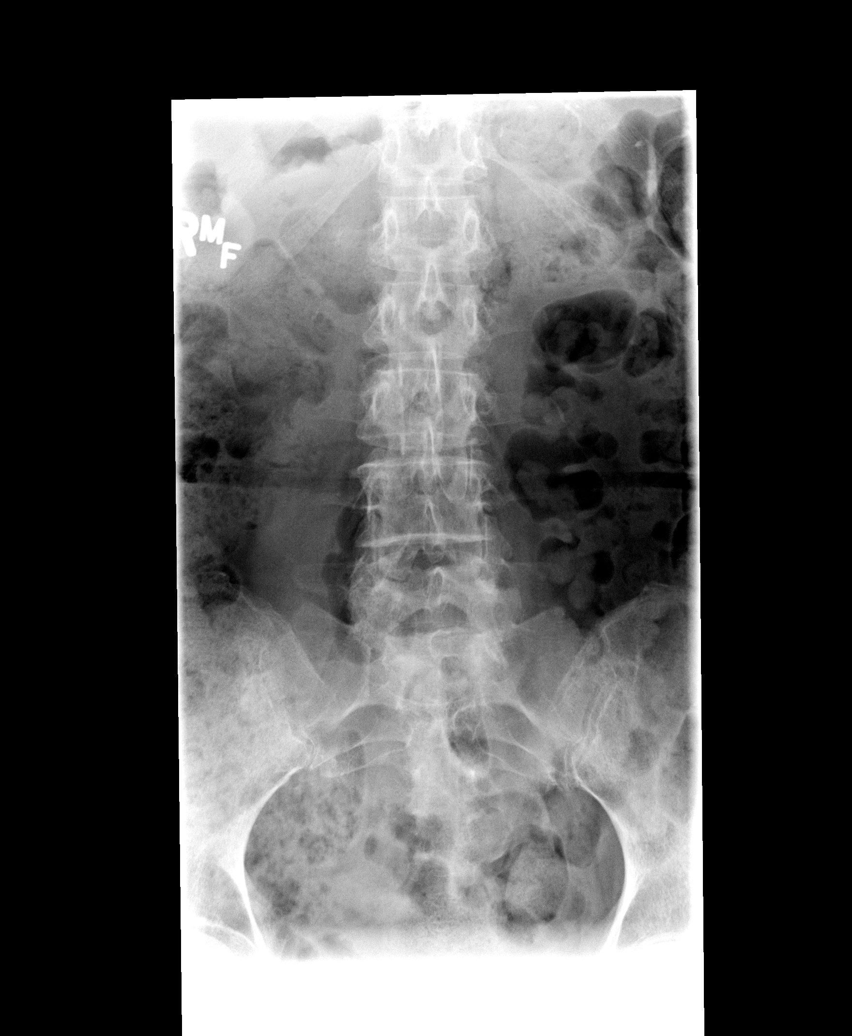

[view not recorded (2 of 5)]
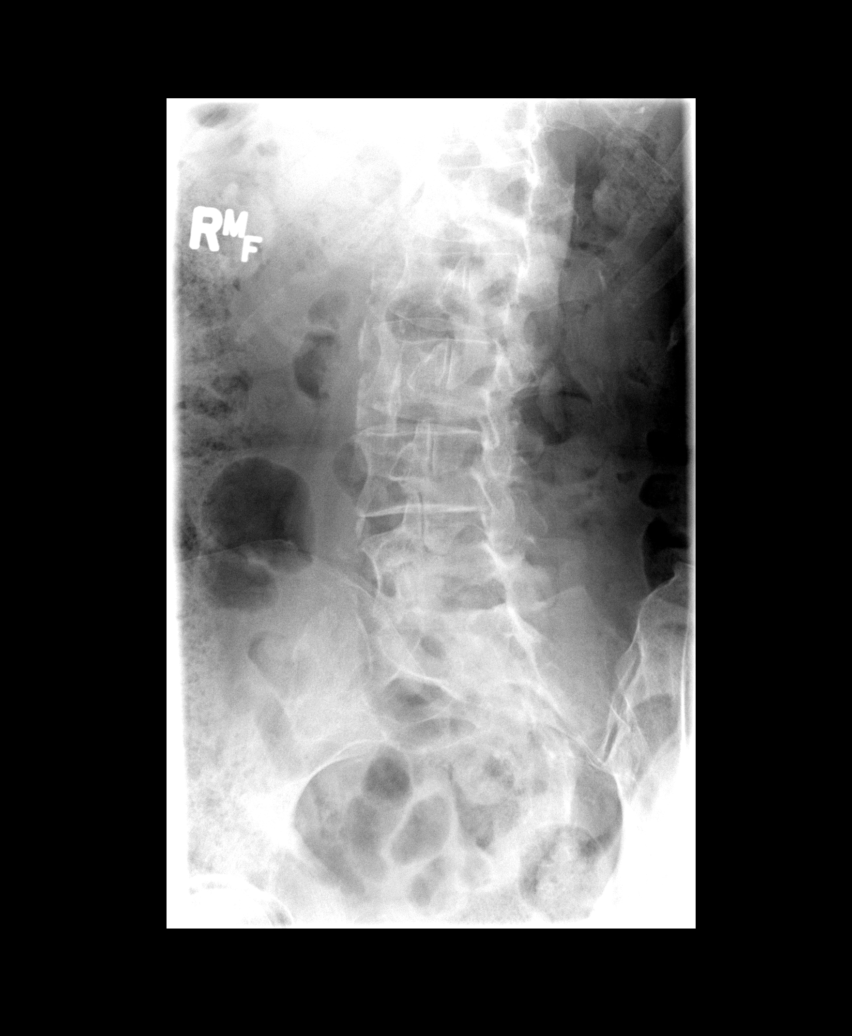

[view not recorded (3 of 5)]
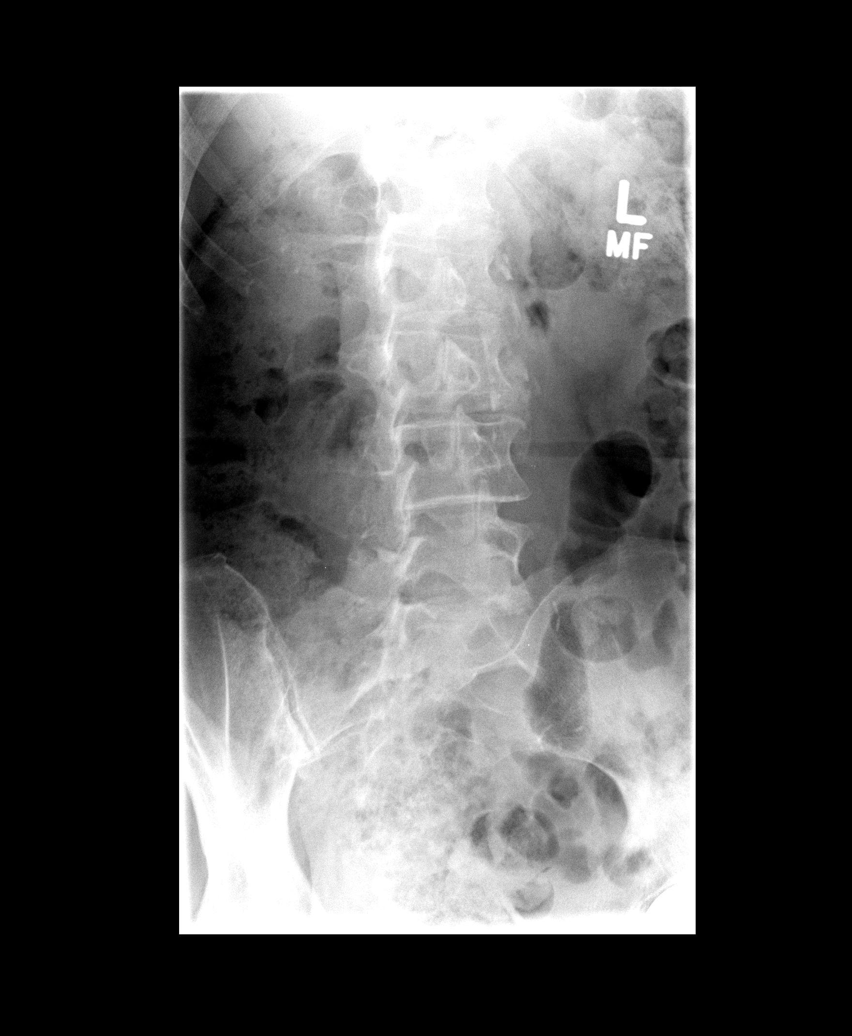

[view not recorded (4 of 5)]
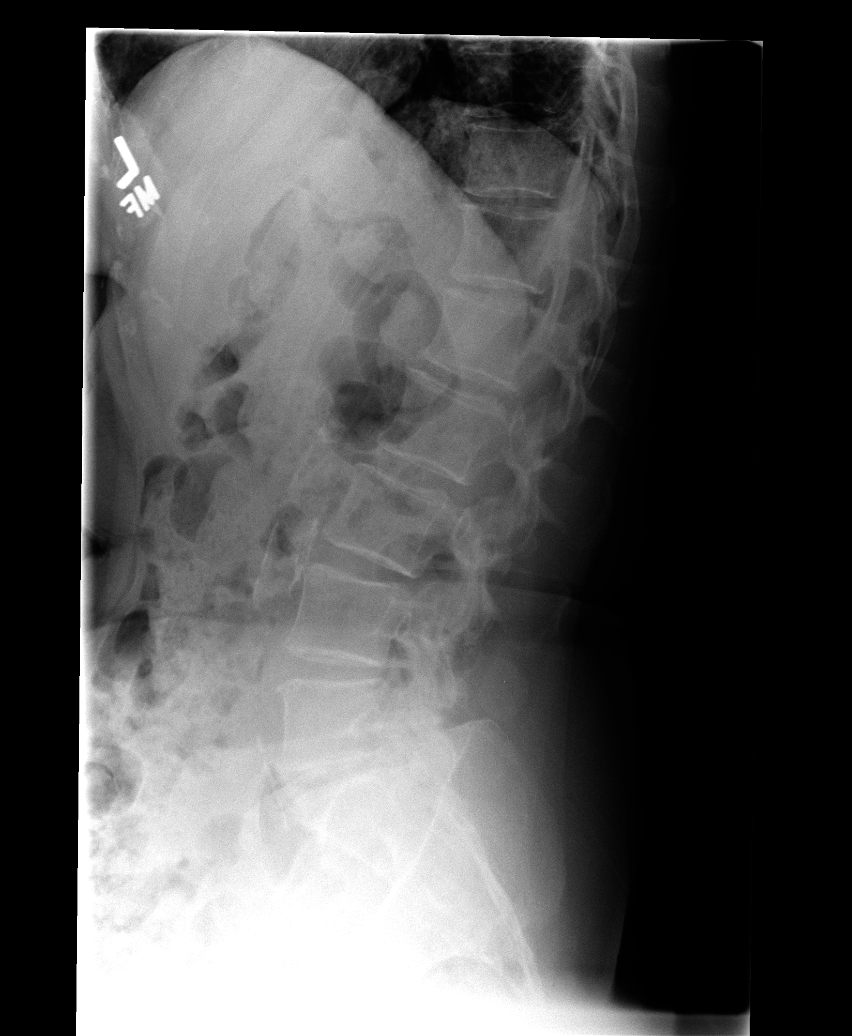

[view not recorded (5 of 5)]
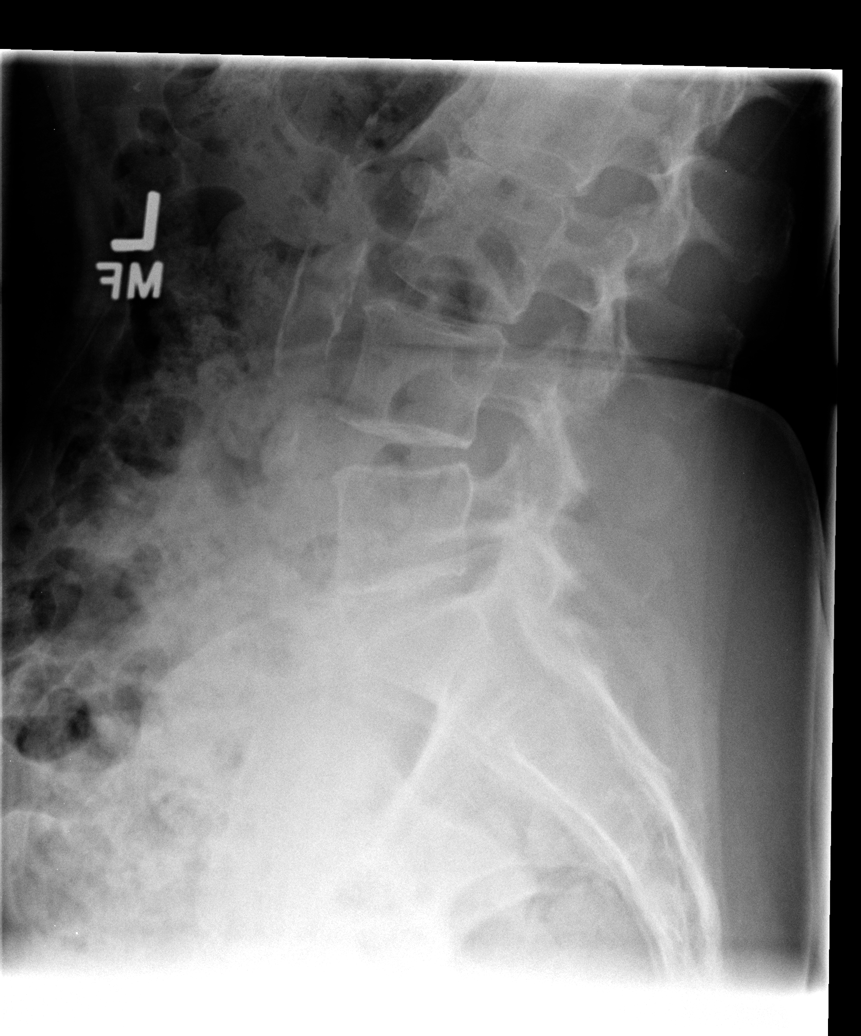

[5 of 5 positions shown; findings below may reference images not displayed]

FINDINGS: Frontal, lateral, spot lumbosacral lateral, and bilateral oblique
views were obtained. There are 5 non-rib-bearing lumbar type
vertebral bodies. There is no fracture or spondylolisthesis. Disc
spaces appear intact. There is facet osteoarthritic change at L5-S1
bilaterally. There are foci of atherosclerotic calcification in the
aorta.
IMPRESSION: Mild osteoarthritic change at L5-S1. Atherosclerotic calcification
in the aorta. No fracture or spondylolisthesis.

## 2016-04-10 ENCOUNTER — Encounter: Payer: Self-pay | Admitting: *Deleted

## 2016-04-10 ENCOUNTER — Emergency Department
Admission: EM | Admit: 2016-04-10 | Discharge: 2016-04-10 | Disposition: A | Discharge status: Home / Self Care | Payer: Medicare HMO | Source: Home / Self Care | Attending: Family Medicine | Admitting: Family Medicine

## 2016-04-10 DIAGNOSIS — H6123 Impacted cerumen, bilateral: Secondary | ICD-10-CM

## 2016-04-10 NOTE — ED Triage Notes (Signed)
Pt c/o LT ear feeling "clogged" x 3 days.

## 2016-04-10 NOTE — ED Provider Notes (Signed)
[facility]    CSN: [id number] Arrival date & time: 04/10/16  1045  First Provider Contact:  First MD Initiated Contact with Patient 04/10/16 1153        History   Chief Complaint Chief Complaint  Patient presents with  . Ear Fullness    HPI Erica Conley is a 82 y.o. female.   Patient complains of sensation of fullness in her left ear for about 3 to 4 days without pain.  She states that she normally wears hearing aids.   The history is provided by the patient.  Ear Fullness  This is a new problem. The current episode started more than 2 days ago. The problem occurs constantly. The problem has not changed since onset.Associated symptoms comments: none. Nothing aggravates the symptoms. Nothing relieves the symptoms. She has tried nothing for the symptoms.    Past Medical History:  Diagnosis Date  . Anxiety   . Depression   . Gastritis   . Neuropathy (HCC)   . Osteoporosis   . Thyroid disease     There are no active problems to display for this patient.   Past Surgical History:  Procedure Laterality Date  . HERNIA REPAIR    . JOINT REPLACEMENT      OB History    No data available       Home Medications    Prior to Admission medications   Medication Sig Start Date End Date Taking? Authorizing Provider  denosumab (PROLIA) 60 MG/ML SOLN injection Inject 60 mg into the skin every 6 (six) months. Administer in upper arm, thigh, or abdomen    Historical Provider, MD  doxepin (SINEQUAN) 10 MG capsule Take 10 mg by mouth.    Historical Provider, MD  gabapentin (NEURONTIN) 100 MG capsule Take 100 mg by mouth 3 (three) times daily.    Historical Provider, MD  levothyroxine (SYNTHROID, LEVOTHROID) 75 MCG tablet Take 75 mcg by mouth daily.    Historical Provider, MD  LORazepam (ATIVAN) 0.5 MG tablet Take 0.5 mg by mouth every 8 (eight) hours.    Historical Provider, MD  pantoprazole (PROTONIX) 40 MG tablet Take 40 mg by mouth daily.    Historical  Provider, MD  pravastatin (PRAVACHOL) 40 MG tablet Take 40 mg by mouth daily.    Historical Provider, MD  promethazine (PHENERGAN) 25 MG tablet Take 25 mg by mouth every 6 (six) hours as needed for nausea.    Historical Provider, MD  quinapril (ACCUPRIL) 5 MG tablet Take by mouth at bedtime.    Historical Provider, MD  sucralfate (CARAFATE) 1 G tablet Take 1 g by mouth 4 (four) times daily.    Historical Provider, MD    Family History Family History  Problem Relation Age of Onset  . Cancer Mother   . Cancer Father     Social History Social History  Substance Use Topics  . Smoking status: Former Games developermoker  . Smokeless tobacco: Never Used  . Alcohol use No     Allergies   Amoxicillin; Azithromycin; Barium sulfate; Cephalexin; Ciprofloxacin; Dexilant [dexlansoprazole]; Doxycycline; Elavil [amitriptyline]; Fosamax [alendronate sodium]; Iodine; Ivp dye [iodinated diagnostic agents]; Macrobid [nitrofurantoin monohyd macro]; Metoclopramide; Metronidazole; Sulfa antibiotics; Tricyclic antidepressants; and Zofran [ondansetron hcl]   Review of Systems Review of Systems  All other systems reviewed and are negative.    Physical Exam Triage Vital Signs ED Triage Vitals  Enc Vitals Group     BP 04/10/16 1132 132/75     Pulse Rate 04/10/16 1132  90     Resp 04/10/16 1132 16     Temp 04/10/16 1132 97.8 F (36.6 C)     Temp Source 04/10/16 1132 Oral     SpO2 04/10/16 1132 98 %     Weight 04/10/16 1132 111 lb (50.3 kg)     Height 04/10/16 1132 5' 2.5" (1.588 m)     Head Circumference --      Peak Flow --      Pain Score 04/10/16 1134 0     Pain Loc --      Pain Edu? --      Excl. in GC? --    No data found.   Updated Vital Signs BP 132/75 (BP Location: Left Arm)   Pulse 90   Temp 97.8 F (36.6 C) (Oral)   Resp 16   Ht 5' 2.5" (1.588 m)   Wt 111 lb (50.3 kg)   SpO2 98%   BMI 19.98 kg/m   Visual Acuity Right Eye Distance:   Left Eye Distance:   Bilateral Distance:     Right Eye Near:   Left Eye Near:    Bilateral Near:     Physical Exam  Constitutional: She appears well-developed and well-nourished. No distress.  HENT:  Head: Normocephalic.  Right Ear: External ear normal.  Left Ear: External ear normal.  Nose: Nose normal.  Mouth/Throat: Oropharynx is clear and moist.  Both canals occluded with cerumen.  Post lavage, both tympanic membranes and canals appear normal.  Eyes: EOM are normal. Pupils are equal, round, and reactive to light.  Neck: Neck supple.  Lymphadenopathy:    She has no cervical adenopathy.  Nursing note and vitals reviewed.    UC Treatments / Results  Labs (all labs ordered are listed, but only abnormal results are displayed) Labs Reviewed - No data to display  Tympanometry:  Right ear tympanogram normal; Left ear tympanogram low peak height  EKG  EKG Interpretation None       Radiology No results found.  Procedures Procedures :Bilateral ear lavage (by nurse)  Medications Ordered in UC Medications - No data to display   Initial Impression / Assessment and Plan / UC Course  I have reviewed the triage vital signs and the nursing notes.  Pertinent labs & imaging results that were available during my care of the patient were reviewed by me and considered in my medical decision making (see chart for details).  Clinical Course   Patient notes that left ear still feels "full" after ear lavage.  Left tympanic membrane and canal appear normal and left tympanogram normal. Recommend followup with ENT if symptoms persist.     Final Clinical Impressions(s) / UC Diagnoses   Final diagnoses:  Cerumen impaction, bilateral    New Prescriptions New Prescriptions   No medications on file     Lattie HawStephen A Ashleymarie Granderson, MD 04/10/16 1232

## 2016-08-07 ENCOUNTER — Encounter: Payer: Self-pay | Admitting: *Deleted

## 2016-08-07 ENCOUNTER — Emergency Department
Admission: EM | Admit: 2016-08-07 | Discharge: 2016-08-07 | Disposition: A | Discharge status: Home / Self Care | Payer: Medicare HMO | Source: Home / Self Care | Attending: Family Medicine | Admitting: Family Medicine

## 2016-08-07 DIAGNOSIS — R21 Rash and other nonspecific skin eruption: Secondary | ICD-10-CM

## 2016-08-07 HISTORY — DX: Hyperlipidemia, unspecified: E78.5

## 2016-08-07 MED ORDER — HYDROCORTISONE 1 % EX CREA: TOPICAL_CREAM | CUTANEOUS | 0 refills | Status: AC

## 2016-08-07 NOTE — ED Provider Notes (Signed)
CSN: [id number]     Arrival date & time 08/07/16  [id number] History   First MD Initiated Contact with Patient 08/07/16 0957     Chief Complaint  Patient presents with  . Rash   (Consider location/radiation/quality/duration/timing/severity/associated sxs/prior Treatment) HPI  Erica Conley is a 82 y.o. female presenting to UC with c/o reddened area to the Left side of her face that started about 2-3 days ago.  She used vaseline ointment this morning with mild improvement. The area is burning slightly but does not itch.  She did start a new cholesterol medication 1 week ago but no other rashes. Denies new soaps, lotions or makeup.     Past Medical History:  Diagnosis Date  . Anxiety   . Depression   . Gastritis   . Hyperlipidemia   . Neuropathy (HCC)   . Osteoporosis   . Thyroid disease    Past Surgical History:  Procedure Laterality Date  . ABDOMINAL HYSTERECTOMY    . HERNIA REPAIR    . JOINT REPLACEMENT    . TOTAL HIP ARTHROPLASTY     Family History  Problem Relation Age of Onset  . Cancer Mother   . Heart disease Mother   . Cancer Father   . Cancer Sister    Social History  Substance Use Topics  . Smoking status: Former Games developermoker  . Smokeless tobacco: Never Used  . Alcohol use No   OB History    No data available     Review of Systems  Constitutional: Negative for chills and fever.  HENT: Negative for sore throat and voice change.   Respiratory: Negative for shortness of breath and wheezing.   Skin: Positive for color change and rash. Negative for wound.    Allergies  Amoxicillin; Azithromycin; Barium sulfate; Cephalexin; Ciprofloxacin; Dexilant [dexlansoprazole]; Doxycycline; Elavil [amitriptyline]; Fosamax [alendronate sodium]; Iodine; Ivp dye [iodinated diagnostic agents]; Macrobid [nitrofurantoin monohyd macro]; Metoclopramide; Metronidazole; Sulfa antibiotics; Tricyclic antidepressants; and Zofran [ondansetron hcl]  Home Medications   Prior to Admission  medications   Medication Sig Start Date End Date Taking? Authorizing Provider  denosumab (PROLIA) 60 MG/ML SOLN injection Inject 60 mg into the skin every 6 (six) months. Administer in upper arm, thigh, or abdomen    Historical Provider, MD  doxepin (SINEQUAN) 10 MG capsule Take 10 mg by mouth.    Historical Provider, MD  gabapentin (NEURONTIN) 100 MG capsule Take 100 mg by mouth 3 (three) times daily.    Historical Provider, MD  hydrocortisone cream 1 % Apply to affected area 2 times daily for 5 days 08/07/16   Junius FinnerErin O'Malley, PA-C  levothyroxine (SYNTHROID, LEVOTHROID) 75 MCG tablet Take 75 mcg by mouth daily.    Historical Provider, MD  LORazepam (ATIVAN) 0.5 MG tablet Take 0.5 mg by mouth every 8 (eight) hours.    Historical Provider, MD  pantoprazole (PROTONIX) 40 MG tablet Take 40 mg by mouth daily.    Historical Provider, MD  pravastatin (PRAVACHOL) 40 MG tablet Take 40 mg by mouth daily.    Historical Provider, MD  promethazine (PHENERGAN) 25 MG tablet Take 25 mg by mouth every 6 (six) hours as needed for nausea.    Historical Provider, MD  quinapril (ACCUPRIL) 5 MG tablet Take by mouth at bedtime.    Historical Provider, MD  sucralfate (CARAFATE) 1 G tablet Take 1 g by mouth 4 (four) times daily.    Historical Provider, MD   Meds Ordered and Administered this Visit  Medications - No data to  display  BP 142/85 (BP Location: Left Arm)   Pulse 104   Temp 98.1 F (36.7 C) (Oral)   Resp 16   Ht 5\' 2"  (1.575 m)   Wt 115 lb (52.2 kg)   SpO2 98%   BMI 21.03 kg/m  No data found.   Physical Exam  Constitutional: She is oriented to person, place, and time. She appears well-developed and well-nourished. No distress.  HENT:  Head: Normocephalic and atraumatic.    1cm area of erythema to Left side of face. No induration or fluctuance. No centralized puncture wound to suggest a sting or bite. No bleeding or discharge.  Eyes: EOM are normal.  Neck: Normal range of motion.    Cardiovascular: Normal rate.   Pulmonary/Chest: Effort normal.  Musculoskeletal: Normal range of motion.  Neurological: She is alert and oriented to person, place, and time.  Skin: Skin is warm and dry. She is not diaphoretic.  Psychiatric: She has a normal mood and affect. Her behavior is normal.  Nursing note and vitals reviewed.   Urgent Care Course   Clinical Course     Procedures (including critical care time)  Labs Review Labs Reviewed - No data to display  Imaging Review No results found.    MDM   1. Rash    Rash to Left side of face. No systemic symptoms.  Pt did start taking a new cholesterol medication 1 week ago, however, due to rash being so localized, doubtful new medication is the cause.  Rx: hydrocortisone cream Encouraged f/u in 4-5 days if not improving, sooner if worsening.     Junius Finnerrin O'Malley, PA-C 08/07/16 1009

## 2016-08-07 NOTE — ED Triage Notes (Signed)
Pt c/o red spot on her LT cheek x 2-3 days. Reports that it burns.

## 2017-05-05 IMAGING — CR DG HIP (WITH OR WITHOUT PELVIS) 2-3V*L*
3 series · 3 of 3 positions shown · non-contrast
Comparison: 06/18/2014.

CLINICAL DATA: LEFT hip pain for 5 days. No injury. Prior hip
replacement surgery 2 years ago.

EXAM:
DG HIP (WITH OR WITHOUT PELVIS) 2-3V LEFT

[pelvis ap]
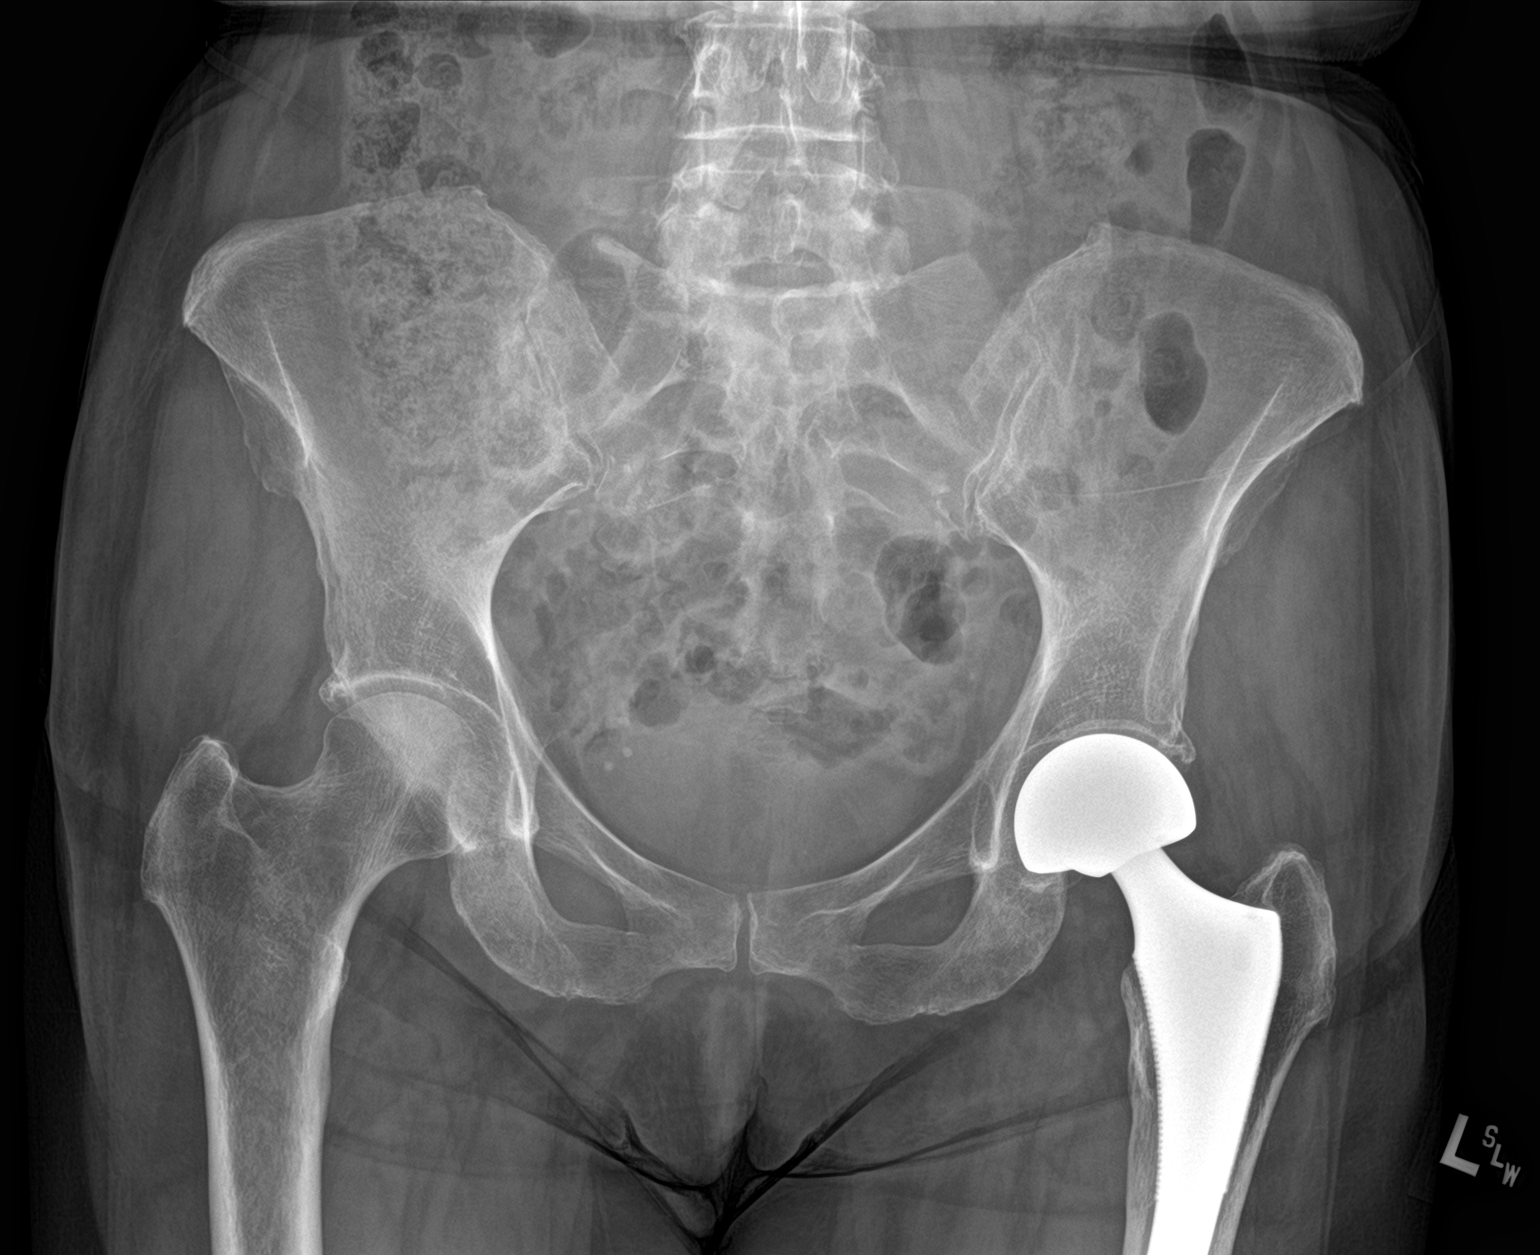

[hip ap]
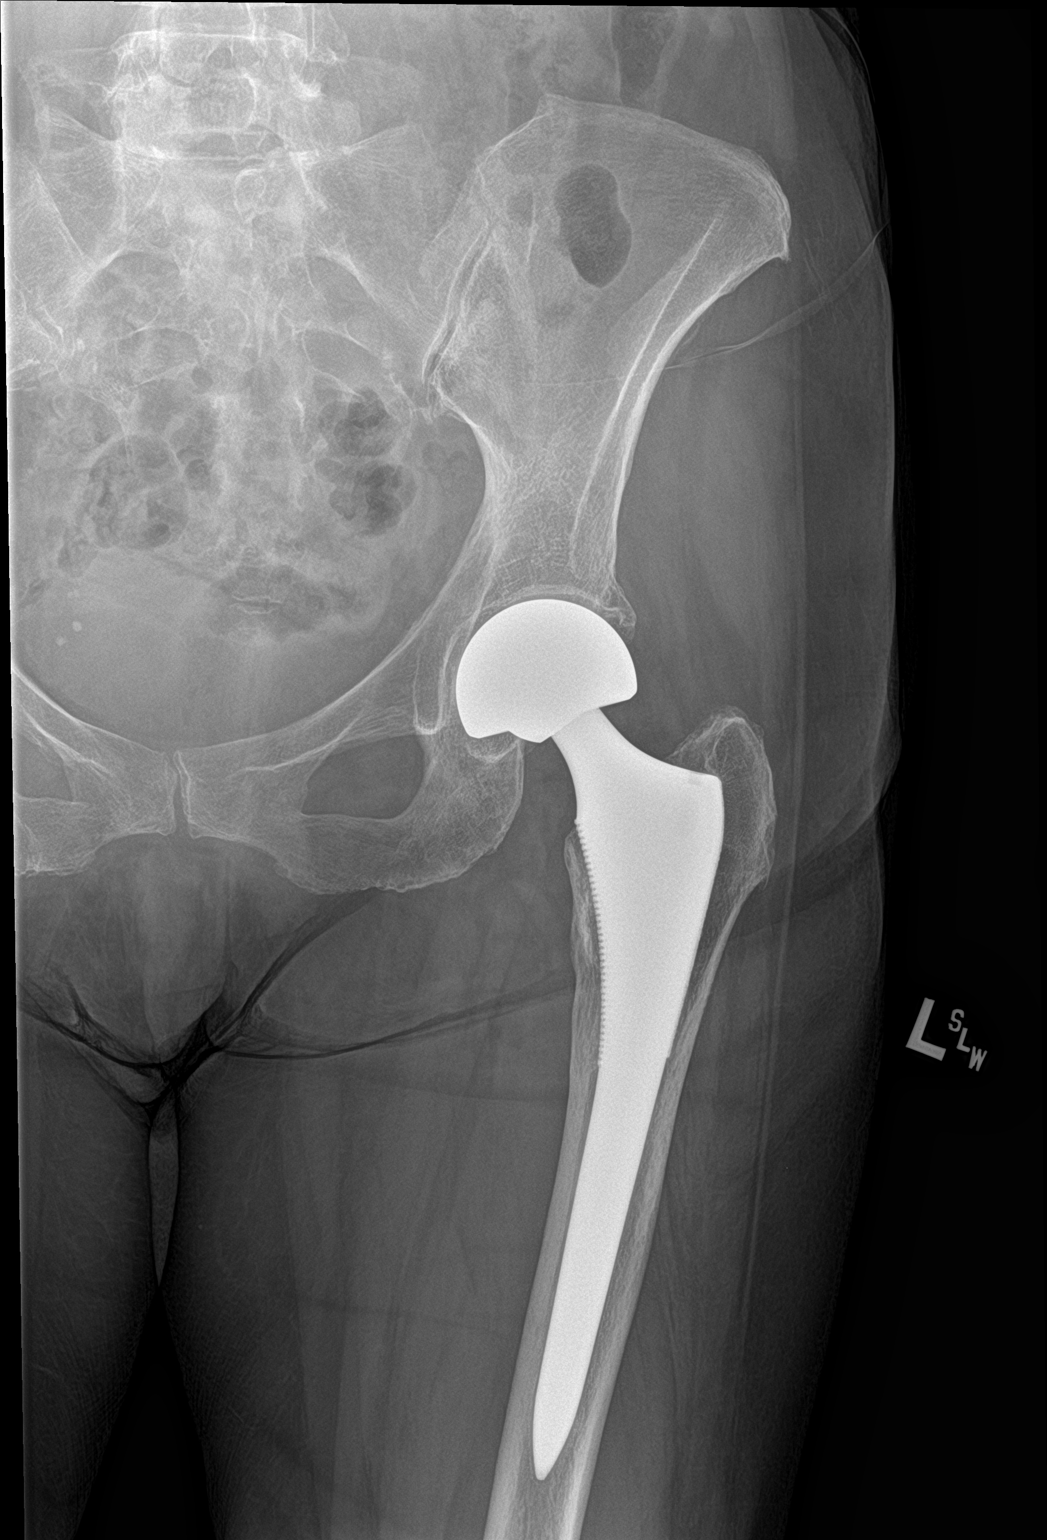

[hip frog leg]
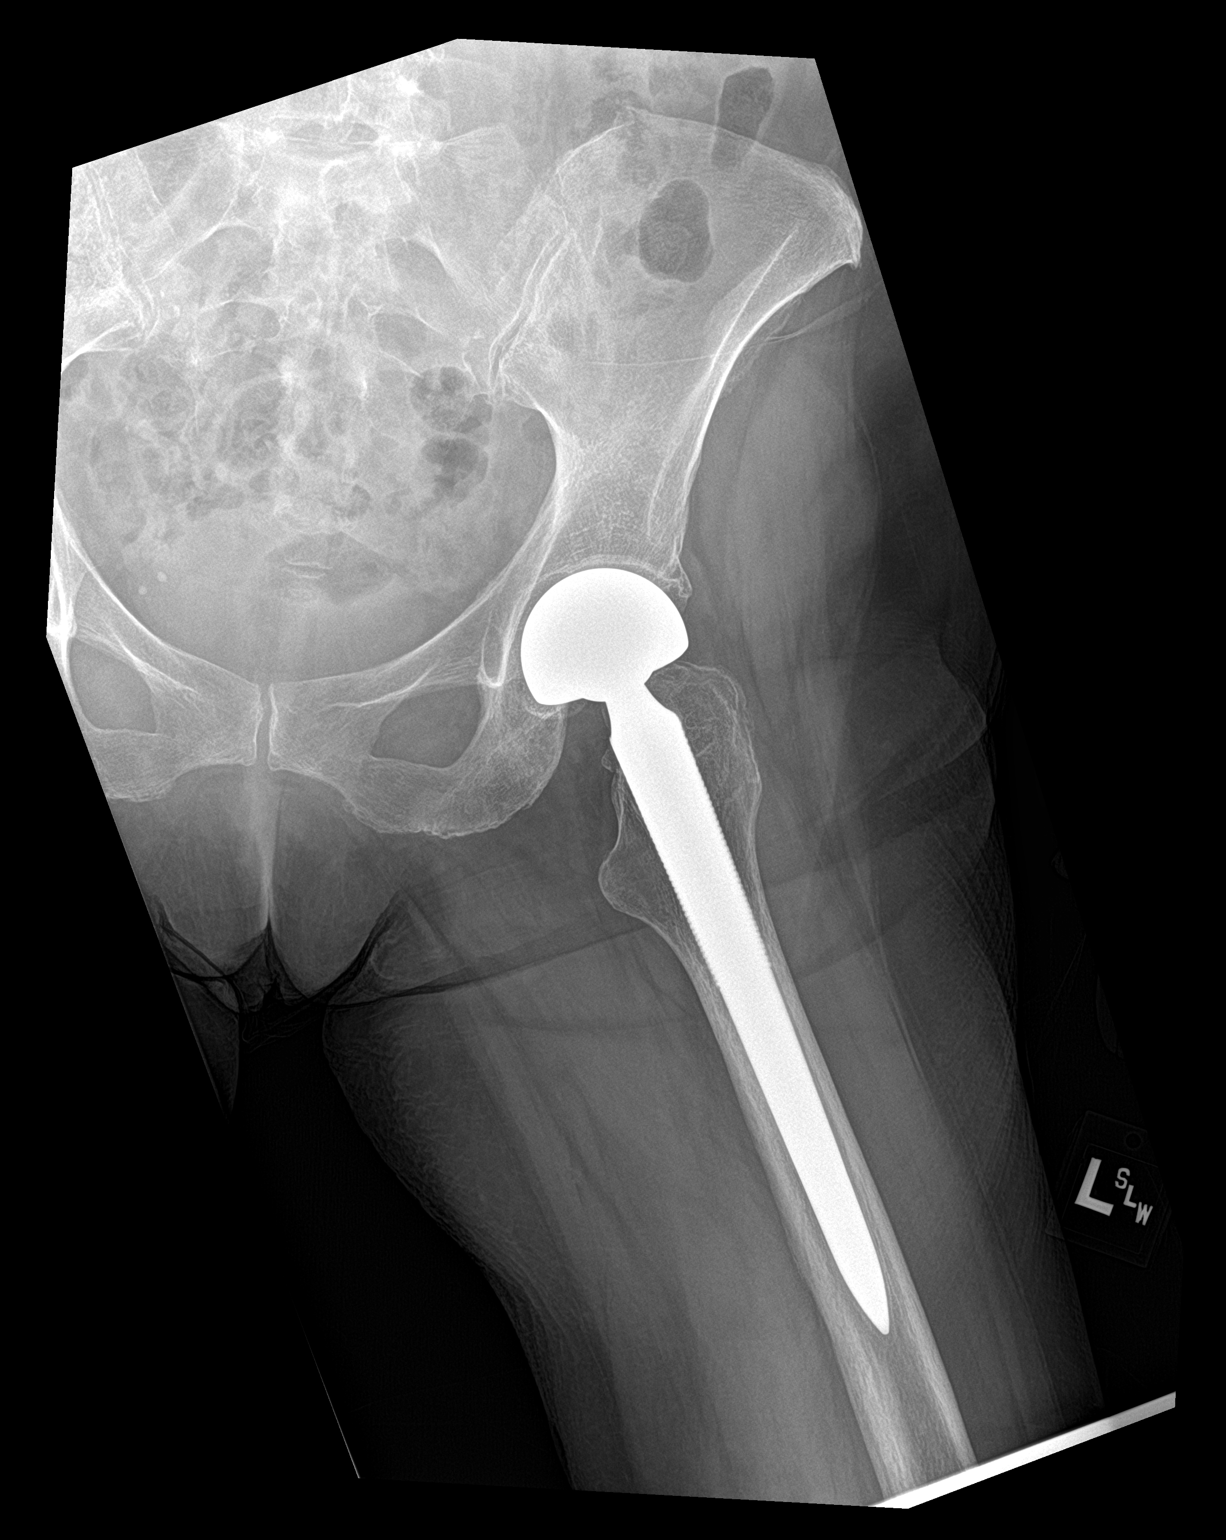

[3 of 3 positions shown; findings below may reference images not displayed]

FINDINGS: Previous LEFT total hip arthroplasty. No pelvic fracture or
prosthetic dislocation.

There is slight lucency surrounding the stem in its distal most
aspect (arrows). This is not an acute finding, but could contribute
to discomfort. It is unclear if this has significantly progressed
from priors. Elective Orthopedic consultation may be warranted.
IMPRESSION: No evidence for prosthetic dislocation or pelvic fracture. Slight
loosening of the stem with periprosthetic lucency inferiorly.

## 2018-04-19 DEATH — deceased
